# Patient Record
Sex: Female | Born: 1965
Health system: Southern US, Community
[De-identification: ages and names within clinical notes are randomized; demographics above are authoritative.]

## PROBLEM LIST (undated history)

## (undated) HISTORY — PX: APPENDECTOMY: SHX54

## (undated) HISTORY — PX: ABDOMINAL HYSTERECTOMY: SHX81

---

## 2009-08-15 ENCOUNTER — Emergency Department (HOSPITAL_COMMUNITY): Admission: EM | Admit: 2009-08-15 | Discharge: 2009-08-15 | Payer: Self-pay | Admitting: Emergency Medicine

## 2010-10-26 LAB — DIFFERENTIAL
Basophils Absolute: 0.1 10*3/uL (ref 0.0–0.1)
Eosinophils Relative: 1 % (ref 0–5)
Lymphocytes Relative: 12 % (ref 12–46)
Lymphs Abs: 1.9 10*3/uL (ref 0.7–4.0)
Monocytes Absolute: 0.6 10*3/uL (ref 0.1–1.0)
Neutro Abs: 12.9 10*3/uL — ABNORMAL HIGH (ref 1.7–7.7)

## 2010-10-26 LAB — POCT I-STAT, CHEM 8
BUN: 6 mg/dL (ref 6–23)
Calcium, Ion: 1.16 mmol/L (ref 1.12–1.32)
Chloride: 106 mEq/L (ref 96–112)
Creatinine, Ser: 0.7 mg/dL (ref 0.4–1.2)

## 2010-10-26 LAB — URINALYSIS, ROUTINE W REFLEX MICROSCOPIC
Bilirubin Urine: NEGATIVE
Glucose, UA: NEGATIVE mg/dL
Hgb urine dipstick: NEGATIVE
Ketones, ur: NEGATIVE mg/dL
Nitrite: NEGATIVE
Specific Gravity, Urine: 1.022 (ref 1.005–1.030)
pH: 7 (ref 5.0–8.0)

## 2010-10-26 LAB — CBC
HCT: 30.8 % — ABNORMAL LOW (ref 36.0–46.0)
Hemoglobin: 10 g/dL — ABNORMAL LOW (ref 12.0–15.0)
RBC: 3.73 MIL/uL — ABNORMAL LOW (ref 3.87–5.11)
RDW: 16.2 % — ABNORMAL HIGH (ref 11.5–15.5)
WBC: 15.5 10*3/uL — ABNORMAL HIGH (ref 4.0–10.5)

## 2010-10-26 LAB — URINE MICROSCOPIC-ADD ON

## 2013-10-30 ENCOUNTER — Encounter (HOSPITAL_COMMUNITY): Payer: Self-pay | Admitting: Emergency Medicine

## 2013-10-30 ENCOUNTER — Emergency Department (HOSPITAL_COMMUNITY)
Admission: EM | Admit: 2013-10-30 | Discharge: 2013-10-30 | Disposition: A | Discharge status: Home / Self Care | Payer: No Typology Code available for payment source | Attending: Emergency Medicine | Admitting: Emergency Medicine

## 2013-10-30 DIAGNOSIS — H6690 Otitis media, unspecified, unspecified ear: Secondary | ICD-10-CM | POA: Insufficient documentation

## 2013-10-30 DIAGNOSIS — F17200 Nicotine dependence, unspecified, uncomplicated: Secondary | ICD-10-CM | POA: Insufficient documentation

## 2013-10-30 DIAGNOSIS — H6691 Otitis media, unspecified, right ear: Secondary | ICD-10-CM

## 2013-10-30 MED ORDER — ANTIPYRINE-BENZOCAINE 5.4-1.4 % OT SOLN: 3.0000 [drp] | OTIC | Status: AC | PRN

## 2013-10-30 MED ORDER — AMOXICILLIN 500 MG PO CAPS: 500.0000 mg | ORAL_CAPSULE | Freq: Three times a day (TID) | ORAL | Status: DC

## 2013-10-30 NOTE — Discharge Instructions (Signed)
Take the prescribed medication as directed, symptoms should start to improve in 24-48 hours. Return to the ED for new or worsening symptoms.

## 2013-10-30 NOTE — ED Notes (Signed)
Pt reports right ear pain, on set over the last 2 weeks.

## 2013-10-30 NOTE — ED Provider Notes (Signed)
CSN: [id number]     Arrival date & time 10/30/13  1125 History   First MD Initiated Contact with Patient 10/30/13 1205     Chief Complaint  Patient presents with  . Otalgia     (Consider location/radiation/quality/duration/timing/severity/associated sxs/prior Treatment) The history is provided by the patient and medical records.   This is a 48 year old female with no significant past medical history presenting to the ED for right ear pain progressively worsening over the past 2 weeks. Patient denies any trauma or foreign body exposure to her ear. She denies any fever. No recent illness. No alleviating or exacerbating factors. No intervention tried prior to arrival.  History reviewed. No pertinent past medical history. History reviewed. No pertinent past surgical history. History reviewed. No pertinent family history. History  Substance Use Topics  . Smoking status: Current Every Day Smoker  . Smokeless tobacco: Not on file  . Alcohol Use: No   OB History   Grav Para Term Preterm Abortions TAB SAB Ect Mult Living                 Review of Systems  HENT: Positive for ear pain.   All other systems reviewed and are negative.      Allergies  Review of patient's allergies indicates no known allergies.  Home Medications   Current Outpatient Rx  Name  Route  Sig  Dispense  Refill  . acetaminophen (TYLENOL) 500 MG tablet   Oral   Take 1,000 mg by mouth every 6 (six) hours as needed for mild pain.          BP 112/71  Pulse 97  Temp(Src) 98.5 F (36.9 C) (Oral)  Resp 18  Ht 5\' 3"  (1.6 m)  Wt 130 lb (58.968 kg)  BMI 23.03 kg/m2  SpO2 100%  Physical Exam  Nursing note and vitals reviewed. Constitutional: She is oriented to person, place, and time. She appears well-developed and well-nourished. No distress.  HENT:  Head: Normocephalic and atraumatic.  Right Ear: There is tenderness. Tympanic membrane is injected and erythematous.  Left Ear: Tympanic membrane and  ear canal normal.  Mouth/Throat: Oropharynx is clear and moist.  Right OM, TM intact  Eyes: Conjunctivae and EOM are normal. Pupils are equal, round, and reactive to light.  Neck: Normal range of motion. Neck supple.  Cardiovascular: Normal rate, regular rhythm and normal heart sounds.   Pulmonary/Chest: Effort normal and breath sounds normal. No respiratory distress. She has no wheezes.  Musculoskeletal: Normal range of motion.  Neurological: She is alert and oriented to person, place, and time.  Skin: Skin is warm and dry. She is not diaphoretic.  Psychiatric: She has a normal mood and affect.    ED Course  Procedures (including critical care time) Labs Review Labs Reviewed - No data to display Imaging Review No results found.   EKG Interpretation None      MDM   Final diagnoses:  Otitis media of right ear   Right OM without perforation.  Will start on amoxicllin and auralgan.    Garlon HatchetLisa M Cypress Fanfan, PA-C 10/30/13 1221

## 2013-11-02 NOTE — ED Provider Notes (Signed)
Medical screening examination/treatment/procedure(s) were performed by non-physician practitioner and as supervising physician I was immediately available for consultation/collaboration.   EKG Interpretation None        Audree CamelScott T Gulianna Hornsby, MD 11/02/13 0730

## 2014-02-13 ENCOUNTER — Encounter (HOSPITAL_COMMUNITY): Payer: Self-pay | Admitting: Emergency Medicine

## 2014-02-13 ENCOUNTER — Emergency Department (HOSPITAL_COMMUNITY)
Admission: EM | Admit: 2014-02-13 | Discharge: 2014-02-13 | Disposition: A | Discharge status: Home / Self Care | Payer: No Typology Code available for payment source | Attending: Emergency Medicine | Admitting: Emergency Medicine

## 2014-02-13 DIAGNOSIS — F17200 Nicotine dependence, unspecified, uncomplicated: Secondary | ICD-10-CM | POA: Insufficient documentation

## 2014-02-13 DIAGNOSIS — H6500 Acute serous otitis media, unspecified ear: Secondary | ICD-10-CM | POA: Insufficient documentation

## 2014-02-13 DIAGNOSIS — H6504 Acute serous otitis media, recurrent, right ear: Secondary | ICD-10-CM

## 2014-02-13 MED ORDER — AMOXICILLIN 500 MG PO CAPS: 1000.0000 mg | ORAL_CAPSULE | Freq: Three times a day (TID) | ORAL | Status: AC

## 2014-02-13 MED ORDER — FLUCONAZOLE 150 MG PO TABS: ORAL_TABLET | ORAL | Status: AC

## 2014-02-13 MED ORDER — ANTIPYRINE-BENZOCAINE 5.4-1.4 % OT SOLN
3.0000 [drp] | OTIC | Status: DC | PRN
  Administered 2014-02-13: 3 [drp] via OTIC
  Filled 2014-02-13: qty 10

## 2014-02-13 NOTE — ED Provider Notes (Signed)
CSN: [id number]     Arrival date & time 02/13/14  0000 History   First MD Initiated Contact with Patient 02/13/14 0023     Chief Complaint  Patient presents with  . Ear Pain      right     (Consider location/radiation/quality/duration/timing/severity/associated sxs/prior Treatment) HPI This is a 48 year old female who was treated for otitis media of the right ear in March of this year. She returns with moderate pain in her right ear for the past several days. She has not taken anything for the pain. There is been no associated drainage, swelling, fever or cold symptoms. If she does state sharp noises such as fire crackers irritate that ear. She states her ear has never felt "quite right" since March.  History reviewed. No pertinent past medical history. Past Surgical History  Procedure Laterality Date  . Abdominal hysterectomy      partial  . Cesarean section    . Appendectomy     History reviewed. No pertinent family history. History  Substance Use Topics  . Smoking status: Current Every Day Smoker -- 1.00 packs/day    Types: Cigarettes  . Smokeless tobacco: Current User  . Alcohol Use: No   OB History   Grav Para Term Preterm Abortions TAB SAB Ect Mult Living                 Review of Systems  All other systems reviewed and are negative.   Allergies  Review of patient's allergies indicates no known allergies.  Home Medications   Prior to Admission medications   Medication Sig Start Date End Date Taking? Authorizing Provider  acetaminophen (TYLENOL) 500 MG tablet Take 1,000 mg by mouth every 6 (six) hours as needed for mild pain.    Historical Provider, MD  amoxicillin (AMOXIL) 500 MG capsule Take 1 capsule (500 mg total) by mouth 3 (three) times daily. 10/30/13   Garlon HatchetLisa M Sanders, PA-C  antipyrine-benzocaine Lyla Son(AURALGAN) otic solution Place 3-4 drops into the right ear every 2 (two) hours as needed for ear pain. 10/30/13   Garlon HatchetLisa M Sanders, PA-C   BP 119/75  Pulse 71   Temp(Src) 98.2 F (36.8 C) (Oral)  Resp 18  SpO2 100%  Physical Exam General: Well-developed, well-nourished female in no acute distress; appearance consistent with age of record HENT: normocephalic; atraumatic; left TM normal, right TM with fluid behind it; pharynx normal Eyes: pupils equal, round and reactive to light; extraocular muscles intact Neck: supple; no lymphadenopathy Heart: regular rate and rhythm Lungs: clear to auscultation bilaterally Abdomen: soft; nondistended Extremities: No deformity; full range of motion Neurologic: Awake, alert and oriented; motor function intact in all extremities and symmetric; no facial droop Skin: Warm and dry Psychiatric: Normal mood and affect    ED Course  Procedures (including critical care time)  MDM  We'll treat for serous otitis media and refer to ENT.      Hanley SeamenJohn L Nedda Gains, MD 02/13/14 [phone]

## 2014-02-13 NOTE — ED Notes (Signed)
MD at bedside. 

## 2014-02-13 NOTE — ED Notes (Signed)
Patient c/o right ear pain, states pain started over the weekend. Patient denies taking any medications at home "I don't like taking pills".

## 2014-02-13 NOTE — Discharge Instructions (Signed)
Otitis Media Otitis media is redness, soreness, and swelling (inflammation) of the middle ear. Otitis media may be caused by allergies or, most commonly, by infection. Often it occurs as a complication of the common cold. SIGNS AND SYMPTOMS Symptoms of otitis media may include:  Earache.  Fever.  Ringing in your ear.  Headache.  Leakage of fluid from the ear. DIAGNOSIS To diagnose otitis media, your health care provider will examine your ear with an otoscope. This is an instrument that allows your health care provider to see into your ear in order to examine your eardrum. Your health care provider also will ask you questions about your symptoms. TREATMENT  Typically, otitis media resolves on its own within 3-5 days. Your health care provider may prescribe medicine to ease your symptoms of pain. If otitis media does not resolve within 5 days or is recurrent, your health care provider may prescribe antibiotic medicines if he or she suspects that a bacterial infection is the cause. HOME CARE INSTRUCTIONS   Take your medicine as directed until it is gone, even if you feel better after the first few days.  Only take over-the-counter or prescription medicines for pain, discomfort, or fever as directed by your health care provider.  Follow up with your health care provider as directed. SEEK MEDICAL CARE IF:  You have otitis media only in one ear, or bleeding from your nose, or both.  You notice a lump on your neck.  You are not getting better in 3-5 days.  You feel worse instead of better. SEEK IMMEDIATE MEDICAL CARE IF:   You have pain that is not controlled with medicine.  You have swelling, redness, or pain around your ear or stiffness in your neck.  You notice that part of your face is paralyzed.  You notice that the bone behind your ear (mastoid) is tender when you touch it. MAKE SURE YOU:   Understand these instructions.  Will watch your condition.  Will get help right  away if you are not doing well or get worse. Document Released: 05/01/2004 Document Revised: 08/01/2013 Document Reviewed: 02/21/2013 Exit[employer] Patient Information 2015 [employer]. This information is not intended to replace advice given to you by your health care provider. Make sure you discuss any questions you have with your health care provider.  

## 2014-07-21 ENCOUNTER — Emergency Department (HOSPITAL_COMMUNITY)
Admission: EM | Admit: 2014-07-21 | Discharge: 2014-07-21 | Disposition: A | Discharge status: Home / Self Care | Payer: No Typology Code available for payment source | Attending: Emergency Medicine | Admitting: Emergency Medicine

## 2014-07-21 ENCOUNTER — Encounter (HOSPITAL_COMMUNITY): Payer: Self-pay | Admitting: Emergency Medicine

## 2014-07-21 DIAGNOSIS — R599 Enlarged lymph nodes, unspecified: Secondary | ICD-10-CM | POA: Insufficient documentation

## 2014-07-21 DIAGNOSIS — H9201 Otalgia, right ear: Secondary | ICD-10-CM | POA: Diagnosis present

## 2014-07-21 DIAGNOSIS — Z720 Tobacco use: Secondary | ICD-10-CM | POA: Insufficient documentation

## 2014-07-21 MED ORDER — AMOXICILLIN 500 MG PO CAPS: 500.0000 mg | ORAL_CAPSULE | Freq: Three times a day (TID) | ORAL | Status: AC

## 2014-07-21 NOTE — ED Notes (Signed)
Pt from home c/o right ear pain. Denies drainage. Taking tylenol for pain.

## 2014-07-21 NOTE — Discharge Instructions (Signed)

## 2014-07-21 NOTE — ED Provider Notes (Signed)
CSN: [id number]     Arrival date & time 07/21/14  1607 History  This chart was scribed for non-physician practitioner, Teressa LowerVrinda Olivine Hiers, NP, working with Tilden FossaElizabeth Rees, MD, by Bronson CurbJacqueline Melvin, ED Scribe. This patient was seen in room WTR9/WTR9 and the patient's care was started at 5:26 PM.   Chief Complaint  Patient presents with  . Otalgia    The history is provided by the patient. No language interpreter was used.     HPI Comments: Julie FinnerCynthia Valenzuela is a 48 y.o. female who presents to the Emergency Department complaining of constant right ear otalgia onset this morning. Patient states she woke up with throbbing right ear pain. She reports history of 3 similar episodes, but is unsure of the cause. She has taken Tylenol for pain. Patient has also tried numbing ear drops, but states this made the pain worse. She denies sore throat, rash, or any recent dental pain. Patient has no history of significant health conditions. NKA to any medication. Patient has past surgical history of partial hysterectomy, and no longer has menstrual periods..   History reviewed. No pertinent past medical history. Past Surgical History  Procedure Laterality Date  . Abdominal hysterectomy      partial  . Cesarean section    . Appendectomy     No family history on file. History  Substance Use Topics  . Smoking status: Current Every Day Smoker -- 1.00 packs/day    Types: Cigarettes  . Smokeless tobacco: Current User  . Alcohol Use: No   OB History    No data available     Review of Systems  Constitutional: Negative for fever.  HENT: Positive for ear pain (right). Negative for dental problem, ear discharge and sore throat.   All other systems reviewed and are negative.     Allergies  Review of patient's allergies indicates no known allergies.  Home Medications   Prior to Admission medications   Medication Sig Start Date End Date Taking? Authorizing Provider  acetaminophen (TYLENOL) 500 MG  tablet Take 1,000 mg by mouth every 6 (six) hours as needed for mild pain or moderate pain (pain).    Yes Historical Provider, MD  antipyrine-benzocaine Lyla Son(AURALGAN) otic solution Place 3-4 drops into the right ear every 2 (two) hours as needed for ear pain. 10/30/13  Yes Garlon HatchetLisa M Sanders, PA-C  amoxicillin (AMOXIL) 500 MG capsule Take 2 capsules (1,000 mg total) by mouth 3 (three) times daily. Patient not taking: Reported on 07/21/2014 02/13/14   Carlisle BeersJohn L Molpus, MD  fluconazole (DIFLUCAN) 150 MG tablet Take 1 tablet as needed for vaginal yeast infection. Patient not taking: Reported on 07/21/2014 02/13/14   Hanley SeamenJohn L Molpus, MD   Triage Vitals: BP 112/74 mmHg  Pulse 73  Temp(Src) 98.7 F (37.1 C) (Oral)  Resp 18  SpO2 100%  Physical Exam  Constitutional: She is oriented to person, place, and time. She appears well-developed and well-nourished. No distress.  HENT:  Head: Normocephalic and atraumatic.  Right Ear: External ear normal.  Left Ear: External ear normal.  Mouth/Throat: Posterior oropharyngeal erythema present. No tonsillar abscesses.  Posterior auricular lymphadenopathy  Eyes: Conjunctivae and EOM are normal.  Neck: Neck supple. No tracheal deviation present.  Cardiovascular: Normal rate and regular rhythm.   Pulmonary/Chest: Effort normal. No respiratory distress.  Musculoskeletal: Normal range of motion.  Neurological: She is alert and oriented to person, place, and time.  Skin: Skin is warm and dry.  Psychiatric: She has a normal mood and affect. Her  behavior is normal.  Nursing note and vitals reviewed.   ED Course  Procedures (including critical care time)  DIAGNOSTIC STUDIES: Oxygen Saturation is 100% on room air, normal by my interpretation.    COORDINATION OF CARE: At 1730 Discussed treatment plan with patient. Patient agrees.   Labs Review Labs Reviewed - No data to display  Imaging Review No results found.   EKG Interpretation None      MDM   Final  diagnoses:  Otalgia, right  Lymph nodes enlarged    Will treat lymphadenopathy with amoxil. Discussed follow up for recurrent symptoms  I personally performed the services described in this documentation, which was scribed in my presence. The recorded information has been reviewed and is accurate.    Teressa LowerVrinda Raliyah Montella, NP 07/21/14 1735  Tilden FossaElizabeth Rees, MD 07/22/14 [phone]

## 2019-06-27 ENCOUNTER — Other Ambulatory Visit: Payer: Self-pay

## 2019-06-27 DIAGNOSIS — Z20822 Contact with and (suspected) exposure to covid-19: Secondary | ICD-10-CM

## 2019-06-29 LAB — NOVEL CORONAVIRUS, NAA: SARS-CoV-2, NAA: NOT DETECTED

## 2020-04-24 ENCOUNTER — Encounter (HOSPITAL_COMMUNITY): Payer: Self-pay

## 2020-04-24 ENCOUNTER — Other Ambulatory Visit: Payer: Self-pay

## 2020-04-24 ENCOUNTER — Ambulatory Visit (HOSPITAL_COMMUNITY)
Admission: EM | Admit: 2020-04-24 | Discharge: 2020-04-24 | Disposition: A | Discharge status: Home / Self Care | Payer: 59 | Attending: Family Medicine | Admitting: Family Medicine

## 2020-04-24 DIAGNOSIS — R079 Chest pain, unspecified: Secondary | ICD-10-CM | POA: Diagnosis not present

## 2020-04-24 DIAGNOSIS — M546 Pain in thoracic spine: Secondary | ICD-10-CM

## 2020-04-24 MED ORDER — CYCLOBENZAPRINE HCL 5 MG PO TABS: 5.0000 mg | ORAL_TABLET | Freq: Three times a day (TID) | ORAL | 0 refills | Status: AC | PRN

## 2020-04-24 MED ORDER — MELOXICAM 7.5 MG PO TABS: 7.5000 mg | ORAL_TABLET | Freq: Every day | ORAL | 0 refills | Status: AC | PRN

## 2020-04-24 NOTE — ED Provider Notes (Signed)
[facility]    CSN: [id number] Arrival date & time: 04/24/20  1641      History   Chief Complaint Chief Complaint  Patient presents with  . Chest Pain    HPI Julie Valenzuela is a 54 y.o. female.   About 4 days of chest pain that starts in her mid upper back and shoots forward into center of chest with certain movements. Denies pain when at rest, palpitations, diaphoresis, nausea, arm or jaw pain. Has not tried anything OTC for this. Has never had this issue in the past and cannot think of an event that may have triggered it. Does have a fhx of heart disease but no personal hx of this known. She is a cigarette smoker.      History reviewed. No pertinent past medical history.  There are no problems to display for this patient.   Past Surgical History:  Procedure Laterality Date  . ABDOMINAL HYSTERECTOMY     partial  . APPENDECTOMY    . CESAREAN SECTION      OB History   No obstetric history on file.      Home Medications    Prior to Admission medications   Medication Sig Start Date End Date Taking? Authorizing Provider  acetaminophen (TYLENOL) 500 MG tablet Take 1,000 mg by mouth every 6 (six) hours as needed for mild pain or moderate pain (pain).     [provider]  amoxicillin (AMOXIL) 500 MG capsule Take 2 capsules (1,000 mg total) by mouth 3 (three) times daily. Patient not taking: Reported on 07/21/2014 02/13/14   Molpus, Jonny Ruiz, MD  amoxicillin (AMOXIL) 500 MG capsule Take 1 capsule (500 mg total) by mouth 3 (three) times daily. 07/21/14   Teressa Lower, NP  antipyrine-benzocaine Lyla Son) otic solution Place 3-4 drops into the right ear every 2 (two) hours as needed for ear pain. 10/30/13   Garlon Hatchet, PA-C  cyclobenzaprine (FLEXERIL) 5 MG tablet Take 1 tablet (5 mg total) by mouth 3 (three) times daily as needed for muscle spasms. 04/24/20   Particia Nearing, PA-C  fluconazole (DIFLUCAN) 150 MG tablet Take 1 tablet as needed for  vaginal yeast infection. Patient not taking: Reported on 07/21/2014 02/13/14   Molpus, Jonny Ruiz, MD  meloxicam (MOBIC) 7.5 MG tablet Take 1 tablet (7.5 mg total) by mouth daily as needed for pain. 04/24/20   Particia Nearing, PA-C    Family History No family history on file.  Social History Social History   Tobacco Use  . Smoking status: Current Every Day Smoker    Packs/day: 1.00    Types: Cigarettes  . Smokeless tobacco: Current User  Substance Use Topics  . Alcohol use: No  . Drug use: No     Allergies   Patient has no known allergies.   Review of Systems Review of Systems PER HPI    Physical Exam Triage Vital Signs ED Triage Vitals  Enc Vitals Group     BP 04/24/20 1649 123/77     Pulse Rate 04/24/20 1649 75     Resp 04/24/20 1649 (!) 22     Temp 04/24/20 1649 98.5 F (36.9 C)     Temp Source 04/24/20 1649 Oral     SpO2 04/24/20 1649 100 %     Weight 04/24/20 1828 150 lb (68 kg)     Height 04/24/20 1828 5\' 3"  (1.6 m)     Head Circumference --      Peak Flow --  Pain Score --      Pain Loc --      Pain Edu? --      Excl. in GC? --    No data found.  Updated Vital Signs BP 123/77 (BP Location: Right Arm)   Pulse 75   Temp 98.5 F (36.9 C) (Oral)   Resp (!) 22   Ht 5\' 3"  (1.6 m)   Wt 150 lb (68 kg)   SpO2 100%   BMI 26.57 kg/m   Visual Acuity Right Eye Distance:   Left Eye Distance:   Bilateral Distance:    Right Eye Near:   Left Eye Near:    Bilateral Near:     Physical Exam Vitals and nursing note reviewed.  Constitutional:      Appearance: Normal appearance. She is not ill-appearing or diaphoretic.  HENT:     Head: Atraumatic.     Mouth/Throat:     Mouth: Mucous membranes are moist.     Pharynx: Oropharynx is clear.  Eyes:     Extraocular Movements: Extraocular movements intact.     Conjunctiva/sclera: Conjunctivae normal.  Cardiovascular:     Rate and Rhythm: Normal rate and regular rhythm.     Pulses: Normal pulses.      Heart sounds: Normal heart sounds.  Pulmonary:     Effort: Pulmonary effort is normal.     Breath sounds: Normal breath sounds. No wheezing or rales.  Abdominal:     General: Bowel sounds are normal. There is no distension.     Palpations: Abdomen is soft.     Tenderness: There is no abdominal tenderness. There is no right CVA tenderness, left CVA tenderness or guarding.  Musculoskeletal:        General: Normal range of motion.     Cervical back: Normal range of motion and neck supple.     Comments: No midline ttp diffusely across spine and paraspinal muscles nontender to palpation Extension of upper back able to reproduce her pain sxs  Skin:    General: Skin is warm and dry.  Neurological:     Mental Status: She is alert and oriented to person, place, and time.  Psychiatric:        Mood and Affect: Mood normal.        Thought Content: Thought content normal.        Judgment: Judgment normal.    UC Treatments / Results  Labs (all labs ordered are listed, but only abnormal results are displayed) Labs Reviewed - No data to display  EKG   Radiology No results found.  Procedures Procedures (including critical care time)  Medications Ordered in UC Medications - No data to display  Initial Impression / Assessment and Plan / UC Course  I have reviewed the triage vital signs and the nursing notes.  Pertinent labs & imaging results that were available during my care of the patient were reviewed by me and considered in my medical decision making (see chart for details).     EKG benign today, NSR with normal rate and no ST or T wave changes. VItals and exam very reassuring as well. Suspect given triggered only by a certain movement that her pain is musculoskeletal in nature and will tx with flexeril and meloxicam prn, stretches, heat but discussed at length with patient that if sxs changed or worsened she should go straight to the ED for further evaluation and cardiac enzymes.  She does not wish to go at this time and  is comfortable with this treatment plan. Did recommend she find a PCP soon to f/u on this issue and start preventative health care.  Final Clinical Impressions(s) / UC Diagnoses   Final diagnoses:  Acute bilateral thoracic back pain  Chest pain, unspecified type   Discharge Instructions   None    ED Prescriptions    Medication Sig Dispense Auth. Provider   cyclobenzaprine (FLEXERIL) 5 MG tablet Take 1 tablet (5 mg total) by mouth 3 (three) times daily as needed for muscle spasms. 30 tablet Particia Nearing, [place]   meloxicam (MOBIC) 7.5 MG tablet Take 1 tablet (7.5 mg total) by mouth daily as needed for pain. 20 tablet Particia Nearing, [place]     PDMP not reviewed this encounter.   Particia Nearing, [place] 04/24/20 1944

## 2020-04-24 NOTE — ED Triage Notes (Signed)
Pt c/o central CP, sharp in nature radiating to mid-back for approx 4 days, currently 6/10 scale. Pain to back increases with movement. CP non-reproducible on palpation. Denies nausea, diaphoresis, lightheadedness, SOB, extremity weakness.  EKG performed and given to Julie Valenzuela for eval who advised that pt can be further evaluated here for CP, but may still need to go to ER after eval. Pt verbalized understanding.

## 2020-05-02 ENCOUNTER — Emergency Department (HOSPITAL_COMMUNITY)
Admission: EM | Admit: 2020-05-02 | Discharge: 2020-05-02 | Disposition: A | Discharge status: Home / Self Care | Payer: 59 | Attending: Emergency Medicine | Admitting: Emergency Medicine

## 2020-05-02 ENCOUNTER — Emergency Department (HOSPITAL_COMMUNITY): Payer: 59

## 2020-05-02 ENCOUNTER — Other Ambulatory Visit: Payer: Self-pay

## 2020-05-02 DIAGNOSIS — M791 Myalgia, unspecified site: Secondary | ICD-10-CM | POA: Insufficient documentation

## 2020-05-02 DIAGNOSIS — F17210 Nicotine dependence, cigarettes, uncomplicated: Secondary | ICD-10-CM | POA: Diagnosis not present

## 2020-05-02 DIAGNOSIS — R0789 Other chest pain: Secondary | ICD-10-CM | POA: Diagnosis not present

## 2020-05-02 LAB — CBC
HCT: 39.2 % (ref 36.0–46.0)
Hemoglobin: 13.2 g/dL (ref 12.0–15.0)
MCH: 31.3 pg (ref 26.0–34.0)
MCHC: 33.7 g/dL (ref 30.0–36.0)
MCV: 92.9 fL (ref 80.0–100.0)
Platelets: 294 10*3/uL (ref 150–400)
RBC: 4.22 MIL/uL (ref 3.87–5.11)
RDW: 13.1 % (ref 11.5–15.5)
WBC: 10.9 10*3/uL — ABNORMAL HIGH (ref 4.0–10.5)
nRBC: 0 % (ref 0.0–0.2)

## 2020-05-02 LAB — I-STAT BETA HCG BLOOD, ED (NOT ORDERABLE): I-stat hCG, quantitative: <5 m[IU]/mL (ref <5)

## 2020-05-02 LAB — BASIC METABOLIC PANEL
Anion gap: 7 (ref 5–15)
BUN: 11 mg/dL (ref 6–20)
CO2: 26 mmol/L (ref 22–32)
Calcium: 9.3 mg/dL (ref 8.9–10.3)
Chloride: 106 mmol/L (ref 98–111)
Creatinine, Ser: 0.68 mg/dL (ref 0.44–1.00)
GFR calc Af Amer: >60 mL/min (ref >60)
GFR calc non Af Amer: >60 mL/min (ref >60)
Glucose, Bld: 91 mg/dL (ref 70–99)
Potassium: 3.6 mmol/L (ref 3.5–5.1)
Sodium: 139 mmol/L (ref 135–145)

## 2020-05-02 LAB — TROPONIN I (HIGH SENSITIVITY): Troponin I (High Sensitivity): <2 ng/L (ref <18)

## 2020-05-02 NOTE — ED Triage Notes (Signed)
Pt c/o intermittent reproducible midsternal  Chest pain, radiating to back x1 wk. Was seen at urgent care for same, given some pain medication nd muscle relaxer's, took them once with some minimal relief.

## 2020-05-02 NOTE — Discharge Instructions (Signed)
We advise that you take the Mobic previously prescribed to you.  This will help reduce inflammation which is likely contributing to your persistent soreness.  Follow-up with a primary care doctor for ongoing care.

## 2020-05-02 NOTE — ED Notes (Signed)
Pt unhooked herself from monitors and left before vitals could be taken.

## 2020-05-03 NOTE — ED Provider Notes (Signed)
[facility]-EMERGENCY DEPT Provider Note   CSN: [id number] Arrival date & time: 05/02/20  1819     History Chief Complaint  Patient presents with  . Chest Pain    Julie Valenzuela is a 54 y.o. female.  54 year old female presents to the emergency department for evaluation of back and chest pain.  She reports that she has had a persistent soreness over the past week.  The soreness originates around her right scapula.  At times it will seem as though it radiates towards her right anterior chest.  She notes onset of symptoms with twisting and other movements including raising her right arm.  It is nonexertional and not associated with shortness of breath, nausea, vomiting, diaphoresis, jaw pain, syncope or near syncope, leg swelling, hemoptysis, abdominal pain, extremity numbness or paresthesias.  Further denies recent fever.  She was seen at urgent care for similar symptoms and prescribed an NSAID as well as Flexeril.  Reports taking this medication inconsistently as she was afraid it would make her drowsy at work.  No personal history of hypertension, dyslipidemia, diabetes, ACS, DVT/PE.  She denies recent surgery or hospitalizations.  No prolonged travel.  She is a daily smoker.  The history is provided by the patient. No language interpreter was used.  Chest Pain      No past medical history on file.  There are no problems to display for this patient.   Past Surgical History:  Procedure Laterality Date  . ABDOMINAL HYSTERECTOMY     partial  . APPENDECTOMY    . CESAREAN SECTION       OB History   No obstetric history on file.     No family history on file.  Social History   Tobacco Use  . Smoking status: Current Every Day Smoker    Packs/day: 1.00    Types: Cigarettes  . Smokeless tobacco: Current User  Substance Use Topics  . Alcohol use: No  . Drug use: No    Home Medications Prior to Admission medications   Medication Sig Start Date End  Date Taking? Authorizing Provider  acetaminophen (TYLENOL) 500 MG tablet Take 500 mg by mouth every 6 (six) hours as needed for mild pain or moderate pain (pain).    Yes [provider]  cyclobenzaprine (FLEXERIL) 5 MG tablet Take 1 tablet (5 mg total) by mouth 3 (three) times daily as needed for muscle spasms. 04/24/20  Yes Particia Nearing, PA-C  meloxicam (MOBIC) 7.5 MG tablet Take 1 tablet (7.5 mg total) by mouth daily as needed for pain. 04/24/20  Yes Particia Nearing, PA-C  amoxicillin (AMOXIL) 500 MG capsule Take 2 capsules (1,000 mg total) by mouth 3 (three) times daily. Patient not taking: Reported on 07/21/2014 02/13/14   Molpus, Jonny Ruiz, MD  amoxicillin (AMOXIL) 500 MG capsule Take 1 capsule (500 mg total) by mouth 3 (three) times daily. Patient not taking: Reported on 05/02/2020 07/21/14   Teressa Lower, NP  antipyrine-benzocaine Lyla Son) otic solution Place 3-4 drops into the right ear every 2 (two) hours as needed for ear pain. Patient not taking: Reported on 05/02/2020 10/30/13   Garlon Hatchet, PA-C  fluconazole (DIFLUCAN) 150 MG tablet Take 1 tablet as needed for vaginal yeast infection. Patient not taking: Reported on 07/21/2014 02/13/14   Molpus, Jonny Ruiz, MD    Allergies    Patient has no known allergies.  Review of Systems   Review of Systems  Cardiovascular: Positive for chest pain.  Ten systems reviewed  and are negative for acute change, except as noted in the HPI.    Physical Exam Updated Vital Signs BP 124/77   Pulse 71   Temp 98.3 F (36.8 C) (Oral)   Resp (!) 25   SpO2 100%   Physical Exam Vitals and nursing note reviewed.  Constitutional:      General: She is not in acute distress.    Appearance: She is well-developed. She is not diaphoretic.     Comments: Nontoxic appearing, pleasant.  HENT:     Head: Normocephalic and atraumatic.  Eyes:     General: No scleral icterus.    Conjunctiva/sclera: Conjunctivae normal.  Pulmonary:      Effort: Pulmonary effort is normal. No respiratory distress.     Breath sounds: No stridor.     Comments: Respirations even and unlabored. Musculoskeletal:        General: Normal range of motion.     Cervical back: Normal range of motion.     Comments: Preserved ROM of BUE. No reproducible TTP to back. No crepitus or deformity.  Skin:    General: Skin is warm and dry.     Coloration: Skin is not pale.     Findings: No erythema or rash.  Neurological:     General: No focal deficit present.     Mental Status: She is alert and oriented to person, place, and time.     Coordination: Coordination normal.  Psychiatric:        Behavior: Behavior normal.     ED Results / Procedures / Treatments   Labs (all labs ordered are listed, but only abnormal results are displayed) Labs Reviewed  CBC - Abnormal; Notable for the following components:      Result Value   WBC 10.9 (*)    All other components within normal limits  BASIC METABOLIC PANEL  I-STAT BETA HCG BLOOD, ED (NOT ORDERABLE)  TROPONIN I (HIGH SENSITIVITY)  TROPONIN I (HIGH SENSITIVITY)    EKG ED ECG REPORT   Date: 05/03/2020  Rate: 79  Rhythm: normal sinus rhythm  QRS Axis: normal  Intervals: normal  ST/T Wave abnormalities: normal  Conduction Disutrbances:none  Narrative Interpretation: NSR without ischemic change; significant artifact in V3.  Old EKG Reviewed: none available  I have personally reviewed the EKG tracing and agree with the computerized printout as noted.   Radiology DG Chest 2 View  Result Date: 05/02/2020 CLINICAL DATA:  Midsternal chest pain. EXAM: CHEST - 2 VIEW COMPARISON:  None. FINDINGS: The cardiomediastinal contours are normal. Mild biapical pleuroparenchymal scarring. Pulmonary vasculature is normal. No consolidation, pleural effusion, or pneumothorax. No acute osseous abnormalities are seen. IMPRESSION: No acute chest findings. Electronically Signed   By: Narda Rutherford M.D.   On:  05/02/2020 20:17    Procedures Procedures (including critical care time)  Medications Ordered in ED Medications - No data to display  ED Course  I have reviewed the triage vital signs and the nursing notes.  Pertinent labs & imaging results that were available during my care of the patient were reviewed by me and considered in my medical decision making (see chart for details).    MDM Rules/Calculators/A&P                          Patient presents to the emergency department for evaluation of chest pain x 1 week.  Low suspicion for emergent cardiac cause given reassuring workup today.  EKG is nonischemic and  troponin negative.  Patient has a heart score of 2 consistent with low risk of acute coronary event.  Chest x-ray without evidence of mediastinal widening to suggest dissection.  No pneumothorax, pneumonia, pleural effusion.  Pulmonary embolus further considered; however, patient without tachycardia, tachypnea, dyspnea, hypoxia.  Patient low risk per Well's criteria.  Suspect MSK etiology given aggravation with movement and twisting. Advised continuation of NSAIDs and flexeril PRN. Encouraged PCP follow up. Return precautions discussed and provided. Patient discharged in stable condition with no unaddressed concerns.  Vitals:   05/02/20 1827 05/02/20 2128  BP: 121/75 124/77  Pulse: 90 71  Resp: 17 (!) 25  Temp: 98.3 F (36.8 C)   TempSrc: Oral   SpO2: 95% 100%    Final Clinical Impression(s) / ED Diagnoses Final diagnoses:  Muscle soreness    Rx / DC Orders ED Discharge Orders    None       Antony Madura, PA-C 05/03/20 0250    Milagros Loll, MD 05/04/20 1325

## 2020-07-05 ENCOUNTER — Encounter (HOSPITAL_COMMUNITY): Payer: Self-pay | Admitting: Emergency Medicine

## 2020-07-05 ENCOUNTER — Other Ambulatory Visit: Payer: Self-pay

## 2020-07-05 ENCOUNTER — Emergency Department (HOSPITAL_COMMUNITY)
Admission: EM | Admit: 2020-07-05 | Discharge: 2020-07-05 | Disposition: A | Discharge status: Home / Self Care | Payer: 59 | Attending: Emergency Medicine | Admitting: Emergency Medicine

## 2020-07-05 DIAGNOSIS — S61211A Laceration without foreign body of left index finger without damage to nail, initial encounter: Secondary | ICD-10-CM | POA: Diagnosis not present

## 2020-07-05 DIAGNOSIS — F17210 Nicotine dependence, cigarettes, uncomplicated: Secondary | ICD-10-CM | POA: Insufficient documentation

## 2020-07-05 DIAGNOSIS — Y93G1 Activity, food preparation and clean up: Secondary | ICD-10-CM | POA: Diagnosis not present

## 2020-07-05 DIAGNOSIS — W25XXXA Contact with sharp glass, initial encounter: Secondary | ICD-10-CM | POA: Insufficient documentation

## 2020-07-05 DIAGNOSIS — Z23 Encounter for immunization: Secondary | ICD-10-CM | POA: Insufficient documentation

## 2020-07-05 DIAGNOSIS — S60949A Unspecified superficial injury of unspecified finger, initial encounter: Secondary | ICD-10-CM | POA: Diagnosis present

## 2020-07-05 MED ORDER — TETANUS-DIPHTH-ACELL PERTUSSIS 5-2.5-18.5 LF-MCG/0.5 IM SUSY
0.5000 mL | PREFILLED_SYRINGE | Freq: Once | INTRAMUSCULAR | Status: AC
  Administered 2020-07-05: 0.5 mL via INTRAMUSCULAR
  Filled 2020-07-05: qty 0.5

## 2020-07-05 NOTE — ED Triage Notes (Signed)
Pt lacerated left hand while washing dishes. She cut her hand at the knuckle of her index finger of her left hand on a glass.

## 2020-07-05 NOTE — Discharge Instructions (Signed)
Adhesive tape repair Tape strips have the advantage of being less painful to apply and lower risk of infection, but do require more caution on your part, as they are more fragile than other skin closure techniques.  It's important to: -Keep the tape clean and dry -Avoid picking at the tape or rubbing the area -Avoid soaking in water (showering is okay-bathing is not) -The tape strips will fall off on their own in about 5-7 days (if they don't, you can gently remove them or soak the wound in water at this time to loosen them).  At this time, scar tissue will be forming under the surface of the wound and your body will do the rest of the work of healing.    

## 2020-07-05 NOTE — ED Provider Notes (Signed)
[facility]-EMERGENCY DEPT Provider Note   CSN: [id number] Arrival date & time: 07/05/20  1650     History Chief Complaint  Patient presents with  . Laceration    Sameena Artus is a 54 y.o. female.  HPI Patient is a 54 year old female presenting today with left index finger pain after cutting her finger on a glass approximately 30 minutes prior to arrival in the ER.  She states that she had some minor bleeding but was able to stop this with some direct pressure.  She states that she is not certain when she had her last tetanus vaccine.  She denies any other injuries.  No other associated symptoms.  No aggravating or mitigating factors besides movement and touch.  She is taken nothing for pain prior to arrival.  She states it is achy worse with movement and touch.    History reviewed. No pertinent past medical history.  There are no problems to display for this patient.   Past Surgical History:  Procedure Laterality Date  . ABDOMINAL HYSTERECTOMY     partial  . APPENDECTOMY    . CESAREAN SECTION       OB History   No obstetric history on file.     History reviewed. No pertinent family history.  Social History   Tobacco Use  . Smoking status: Current Every Day Smoker    Packs/day: 1.00    Types: Cigarettes  . Smokeless tobacco: Current User  Substance Use Topics  . Alcohol use: No  . Drug use: No    Home Medications Prior to Admission medications   Medication Sig Start Date End Date Taking? Authorizing Provider  acetaminophen (TYLENOL) 500 MG tablet Take 500 mg by mouth every 6 (six) hours as needed for mild pain or moderate pain (pain).     [provider]  amoxicillin (AMOXIL) 500 MG capsule Take 2 capsules (1,000 mg total) by mouth 3 (three) times daily. Patient not taking: Reported on 07/21/2014 02/13/14   Molpus, Jonny Ruiz, MD  amoxicillin (AMOXIL) 500 MG capsule Take 1 capsule (500 mg total) by mouth 3 (three) times  daily. Patient not taking: Reported on 05/02/2020 07/21/14   Teressa Lower, NP  antipyrine-benzocaine Lyla Son) otic solution Place 3-4 drops into the right ear every 2 (two) hours as needed for ear pain. Patient not taking: Reported on 05/02/2020 10/30/13   Garlon Hatchet, PA-C  cyclobenzaprine (FLEXERIL) 5 MG tablet Take 1 tablet (5 mg total) by mouth 3 (three) times daily as needed for muscle spasms. 04/24/20   Particia Nearing, PA-C  fluconazole (DIFLUCAN) 150 MG tablet Take 1 tablet as needed for vaginal yeast infection. Patient not taking: Reported on 07/21/2014 02/13/14   Molpus, Jonny Ruiz, MD  meloxicam (MOBIC) 7.5 MG tablet Take 1 tablet (7.5 mg total) by mouth daily as needed for pain. 04/24/20   Particia Nearing, PA-C    Allergies    Patient has no known allergies.  Review of Systems   Review of Systems  Constitutional: Negative for fever.  HENT: Negative for congestion.   Respiratory: Negative for shortness of breath.   Cardiovascular: Negative for chest pain.  Gastrointestinal: Negative for abdominal distention.  Skin: Positive for wound.  Neurological: Negative for dizziness and headaches.    Physical Exam Updated Vital Signs BP (!) 143/89 (BP Location: Right Arm)   Pulse 84   Temp 97.9 F (36.6 C)   Resp 16   Ht 5\' 3"  (1.6 m)   Wt 68.9 kg  SpO2 100%   BMI 26.93 kg/m   Physical Exam Vitals and nursing note reviewed.  Constitutional:      General: She is not in acute distress.    Appearance: Normal appearance. She is not ill-appearing.  HENT:     Head: Normocephalic and atraumatic.  Eyes:     General: No scleral icterus.       Right eye: No discharge.        Left eye: No discharge.     Conjunctiva/sclera: Conjunctivae normal.  Pulmonary:     Effort: Pulmonary effort is normal.     Breath sounds: No stridor.  Musculoskeletal:     Comments: FROM of left MCP, PIP and DIP of 2nd digit.  Flexion and extension WNL strength  Skin:    General: Skin  is warm and dry.     Comments: 2 cm laceration of the skin overlying the left 2nd digit MCP   Neurological:     Mental Status: She is alert and oriented to person, place, and time. Mental status is at baseline.     Comments: Sensation intact.     ED Results / Procedures / Treatments   Labs (all labs ordered are listed, but only abnormal results are displayed) Labs Reviewed - No data to display  EKG None  Radiology No results found.  Procedures .Marland KitchenLaceration Repair  Date/Time: 07/05/2020 5:58 PM Performed by: Gailen Shelter, PA Authorized by: Gailen Shelter, PA   Consent:    Consent obtained:  Verbal   Consent given by:  Patient   Risks discussed:  Infection, need for additional repair, pain, poor cosmetic result and poor wound healing   Alternatives discussed:  No treatment and delayed treatment Universal protocol:    Procedure explained and questions answered to patient or proxy's satisfaction: yes     Relevant documents present and verified: yes     Test results available and properly labeled: yes     Imaging studies available: yes     Required blood products, implants, devices, and special equipment available: yes     Site/side marked: yes     Immediately prior to procedure, a time out was called: yes     Patient identity confirmed:  Verbally with patient Anesthesia (see MAR for exact dosages):    Anesthesia method:  None Laceration details:    Length (cm):  2 Repair type:    Repair type:  Simple Exploration:    Hemostasis achieved with:  Direct pressure   Wound extent: no foreign bodies/material noted and no tendon damage noted     Contaminated: no   Treatment:    Area cleansed with:  Saline   Amount of cleaning:  Standard   Irrigation solution:  Sterile saline   Irrigation method:  Pressure wash   Visualized foreign bodies/material removed: no   Skin repair:    Repair method:  Steri-Strips and tissue adhesive   Number of Steri-Strips:   3 Approximation:    Approximation:  Close Post-procedure details:    Dressing:  Antibiotic ointment and non-adherent dressing   Patient tolerance of procedure:  Tolerated well, no immediate complications   (including critical care time)  Medications Ordered in ED Medications  Tdap (BOOSTRIX) injection 0.5 mL (0.5 mLs Intramuscular Given 07/05/20 1738)    ED Course  I have reviewed the triage vital signs and the nursing notes.  Pertinent labs & imaging results that were available during my care of the patient were reviewed by me and considered  in my medical decision making (see chart for details).    MDM Rules/Calculators/A&P                          Patient is a 54 year old female presenting today for laceration of the hand.  Given the shallow and not gaping appearance of the wound I had shared decision-making physician with patient between suture repair versus tape and Dermabond.  She would prefer the latter.  Pressure irrigation performed. Wound explored and base of wound visualized in a bloodless field without evidence of foreign body.  Laceration occurred < 8 hours prior to repair which was well tolerated. Tdap updated.  Pt has no comorbidities to effect normal wound healing. Pt discharged without antibiotics.  Discussed suture home care with patient and answered questions. Pt to follow-up for wound check and suture removal in 7 days; they are to return to the ED sooner for signs of infection. Pt is hemodynamically stable with no complaints prior to dc.   She is agreeable to discharge at this time.  Final Clinical Impression(s) / ED Diagnoses Final diagnoses:  Laceration of left index finger without foreign body without damage to nail, initial encounter    Rx / DC Orders ED Discharge Orders    None       Gailen Shelter, [place] 07/05/20 2042    Sabino Donovan, MD 07/05/20 2328

## 2022-03-22 IMAGING — CR DG CHEST 2V
2 series · 2 of 2 positions shown · non-contrast
Comparison: None.

CLINICAL DATA: Midsternal chest pain.

EXAM:
CHEST - 2 VIEW

[w chest pa]
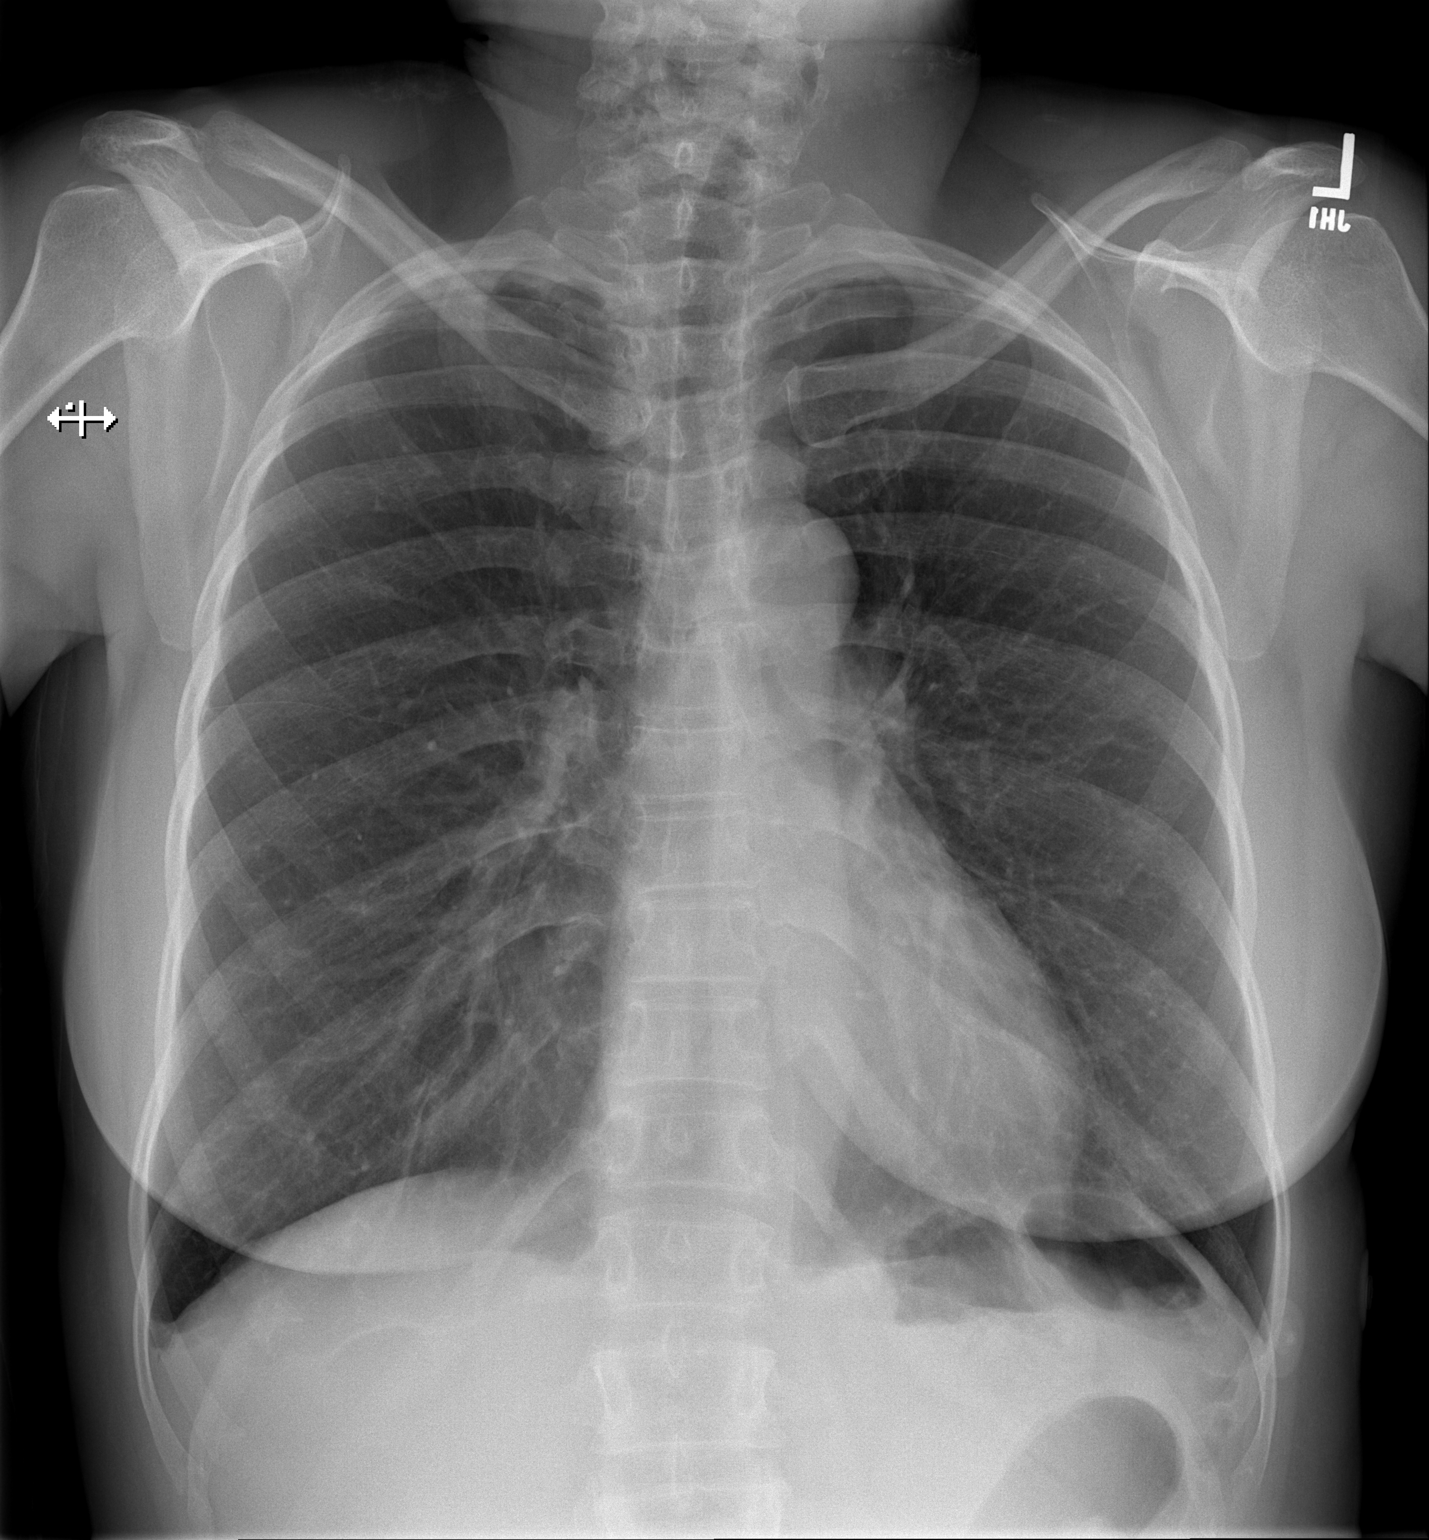

[w chest lat]
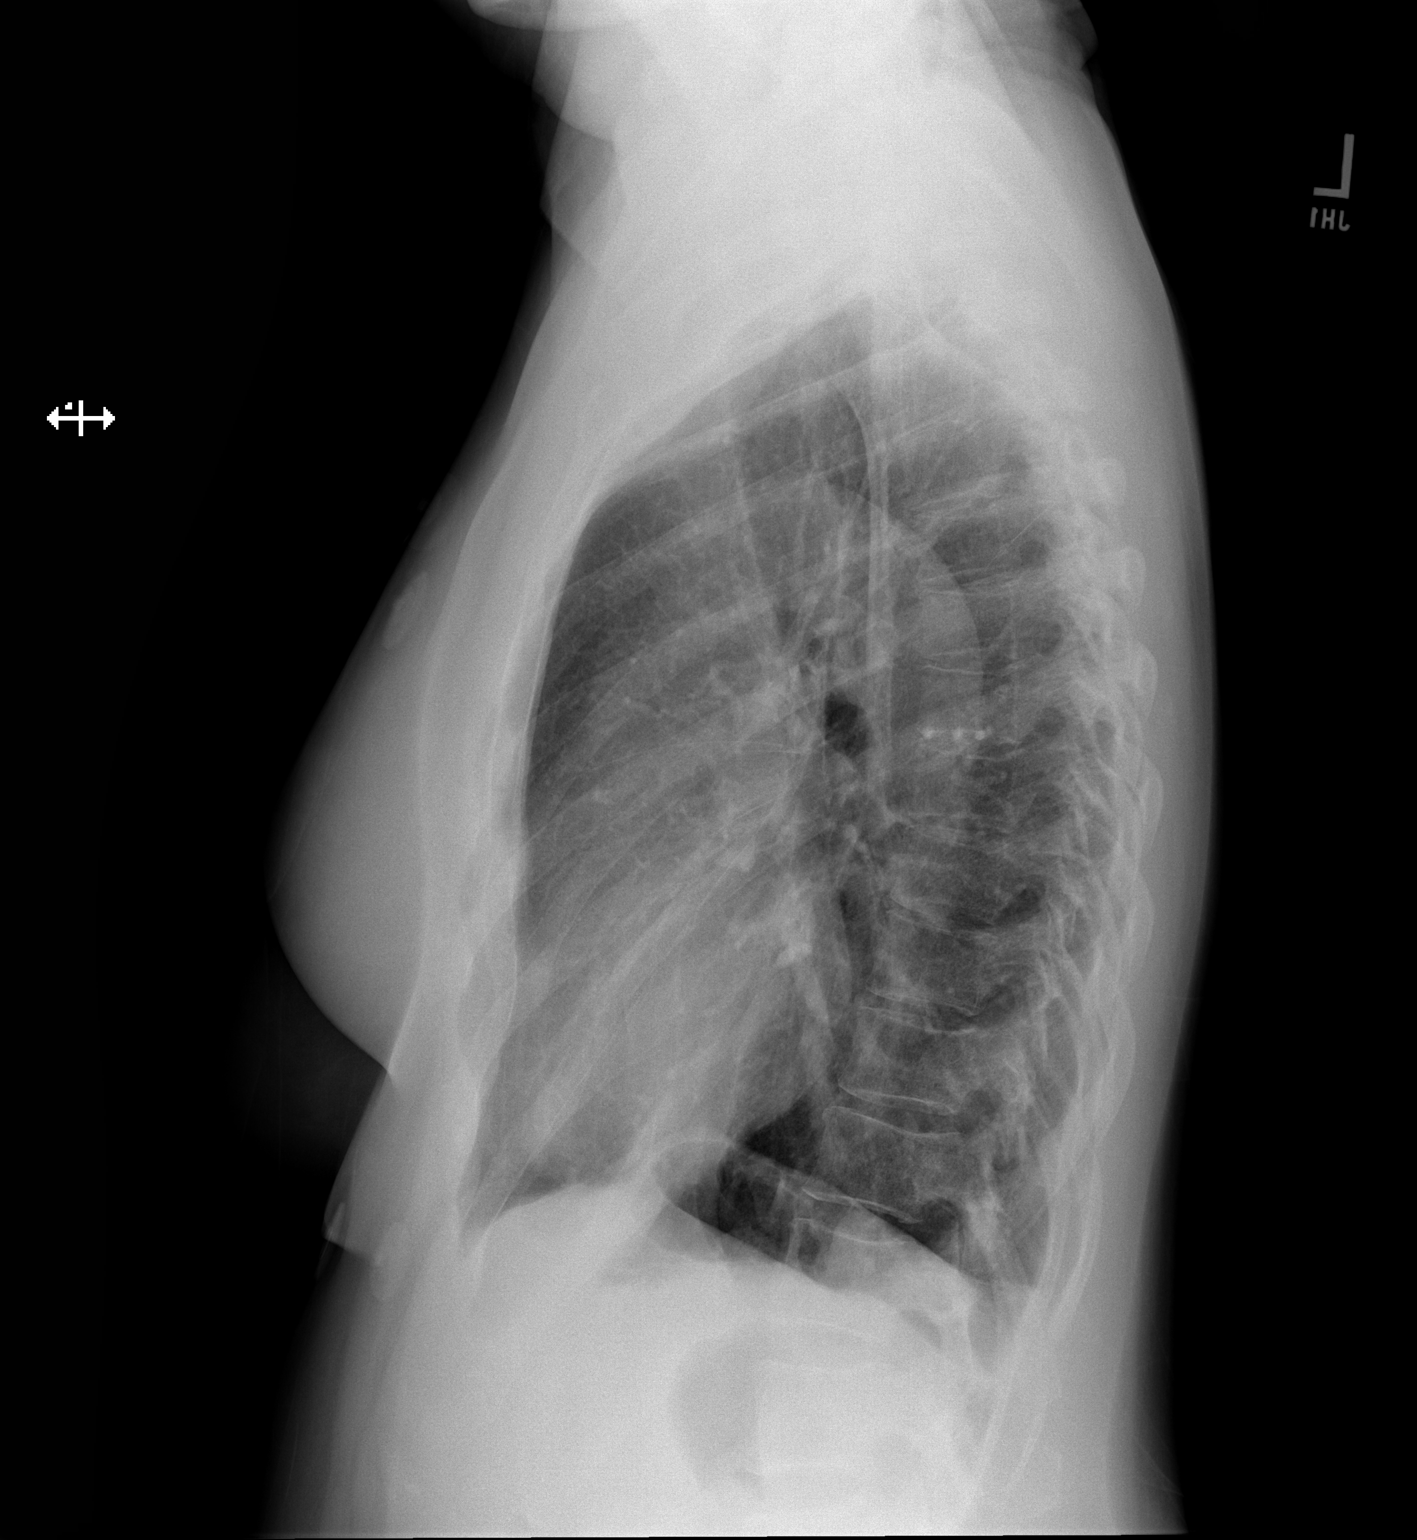

[2 of 2 positions shown; findings below may reference images not displayed]

FINDINGS: The cardiomediastinal contours are normal. Mild biapical
pleuroparenchymal scarring. Pulmonary vasculature is normal. No
consolidation, pleural effusion, or pneumothorax. No acute osseous
abnormalities are seen.
IMPRESSION: No acute chest findings.
